# Patient Record
Sex: Female | Born: 1970 | Race: White | Hispanic: No | Marital: Married | Smoking: Current every day smoker
Health system: Southern US, Community
[De-identification: ages and names within clinical notes are randomized; demographics above are authoritative.]

## PROBLEM LIST (undated history)

## (undated) DIAGNOSIS — O24419 Gestational diabetes mellitus in pregnancy, unspecified control: Secondary | ICD-10-CM

## (undated) DIAGNOSIS — G43909 Migraine, unspecified, not intractable, without status migrainosus: Secondary | ICD-10-CM

## (undated) DIAGNOSIS — J45909 Unspecified asthma, uncomplicated: Secondary | ICD-10-CM

## (undated) HISTORY — DX: Migraine, unspecified, not intractable, without status migrainosus: G43.909

## (undated) HISTORY — DX: Unspecified asthma, uncomplicated: J45.909

## (undated) HISTORY — DX: Gestational diabetes mellitus in pregnancy, unspecified control: O24.419

---

## 2005-01-13 ENCOUNTER — Emergency Department: Payer: Self-pay | Admitting: Emergency Medicine

## 2005-04-21 ENCOUNTER — Emergency Department: Payer: Self-pay | Admitting: Unknown Physician Specialty

## 2005-07-01 ENCOUNTER — Emergency Department: Payer: Self-pay | Admitting: Emergency Medicine

## 2005-07-01 ENCOUNTER — Other Ambulatory Visit: Payer: Self-pay

## 2005-08-01 ENCOUNTER — Emergency Department: Payer: Self-pay | Admitting: Emergency Medicine

## 2005-12-28 ENCOUNTER — Emergency Department: Payer: Self-pay | Admitting: Emergency Medicine

## 2006-03-25 ENCOUNTER — Emergency Department: Payer: Self-pay | Admitting: Emergency Medicine

## 2006-08-24 ENCOUNTER — Emergency Department: Payer: Self-pay | Admitting: Emergency Medicine

## 2008-12-03 ENCOUNTER — Emergency Department: Payer: Self-pay | Admitting: Emergency Medicine

## 2009-06-11 ENCOUNTER — Emergency Department: Payer: Self-pay | Admitting: Emergency Medicine

## 2009-08-05 ENCOUNTER — Emergency Department: Payer: Self-pay | Admitting: Emergency Medicine

## 2010-04-09 ENCOUNTER — Emergency Department: Payer: Self-pay | Admitting: Emergency Medicine

## 2010-07-22 ENCOUNTER — Emergency Department: Payer: Self-pay | Admitting: Emergency Medicine

## 2010-08-24 ENCOUNTER — Emergency Department: Payer: Self-pay | Admitting: Internal Medicine

## 2011-09-18 ENCOUNTER — Emergency Department: Payer: Self-pay | Admitting: *Deleted

## 2011-11-21 ENCOUNTER — Emergency Department: Payer: Self-pay | Admitting: Emergency Medicine

## 2012-03-01 ENCOUNTER — Emergency Department: Payer: Self-pay | Admitting: Unknown Physician Specialty

## 2012-03-01 LAB — URINALYSIS, COMPLETE
Bilirubin,UR: NEGATIVE
Glucose,UR: NEGATIVE mg/dL (ref 0–75)
Ph: 5 (ref 4.5–8.0)
Protein: NEGATIVE
RBC,UR: 4 /HPF (ref 0–5)
Specific Gravity: 1.024 (ref 1.003–1.030)
Squamous Epithelial: 7
WBC UR: 20 /HPF (ref 0–5)

## 2012-03-01 LAB — COMPREHENSIVE METABOLIC PANEL
Anion Gap: 6 — ABNORMAL LOW (ref 7–16)
BUN: 10 mg/dL (ref 7–18)
Calcium, Total: 9.3 mg/dL (ref 8.5–10.1)
Co2: 26 mmol/L (ref 21–32)
EGFR (African American): >60
Glucose: 91 mg/dL (ref 65–99)
SGPT (ALT): 23 U/L
Sodium: 139 mmol/L (ref 136–145)
Total Protein: 7.9 g/dL (ref 6.4–8.2)

## 2012-03-01 LAB — CBC
MCH: 33.6 pg (ref 26.0–34.0)
MCHC: 34.4 g/dL (ref 32.0–36.0)
RDW: 13.1 % (ref 11.5–14.5)

## 2012-05-22 ENCOUNTER — Emergency Department: Payer: Self-pay | Admitting: Emergency Medicine

## 2012-07-05 ENCOUNTER — Emergency Department: Payer: Self-pay | Admitting: Emergency Medicine

## 2012-07-05 LAB — URINALYSIS, COMPLETE
Bacteria: NONE SEEN
Bilirubin,UR: NEGATIVE
Glucose,UR: NEGATIVE mg/dL (ref 0–75)
Ketone: NEGATIVE
Nitrite: NEGATIVE
Ph: 5 (ref 4.5–8.0)
RBC,UR: 1 /HPF (ref 0–5)
Specific Gravity: 1.017 (ref 1.003–1.030)
Squamous Epithelial: 2
WBC UR: <1 /HPF (ref 0–5)

## 2012-07-05 LAB — LIPASE, BLOOD: Lipase: 176 U/L (ref 73–393)

## 2012-07-05 LAB — COMPREHENSIVE METABOLIC PANEL
Albumin: 3.5 g/dL (ref 3.4–5.0)
Alkaline Phosphatase: 65 U/L (ref 50–136)
Anion Gap: 8 (ref 7–16)
BUN: 15 mg/dL (ref 7–18)
Chloride: 110 mmol/L — ABNORMAL HIGH (ref 98–107)
Creatinine: 0.75 mg/dL (ref 0.60–1.30)
Glucose: 122 mg/dL — ABNORMAL HIGH (ref 65–99)
Potassium: 4.4 mmol/L (ref 3.5–5.1)
SGOT(AST): 20 U/L (ref 15–37)
SGPT (ALT): 22 U/L (ref 12–78)
Total Protein: 7.4 g/dL (ref 6.4–8.2)

## 2012-07-05 LAB — CBC
MCH: 33.7 pg (ref 26.0–34.0)
MCHC: 34.6 g/dL (ref 32.0–36.0)
Platelet: 263 10*3/uL (ref 150–440)
RBC: 4.11 10*6/uL (ref 3.80–5.20)
RDW: 13 % (ref 11.5–14.5)
WBC: 6.8 10*3/uL (ref 3.6–11.0)

## 2012-07-05 LAB — PREGNANCY, URINE: Pregnancy Test, Urine: NEGATIVE m[IU]/mL

## 2012-11-28 ENCOUNTER — Emergency Department: Payer: Self-pay | Admitting: Emergency Medicine

## 2012-11-28 LAB — BASIC METABOLIC PANEL
BUN: 11 mg/dL (ref 7–18)
Creatinine: 0.55 mg/dL — ABNORMAL LOW (ref 0.60–1.30)
EGFR (African American): >60
EGFR (Non-African Amer.): >60
Potassium: 4.1 mmol/L (ref 3.5–5.1)
Sodium: 133 mmol/L — ABNORMAL LOW (ref 136–145)

## 2012-11-28 LAB — TROPONIN I: Troponin-I: <0.02 ng/mL

## 2012-11-28 LAB — CBC
HGB: 14.4 g/dL (ref 12.0–16.0)
MCH: 33.6 pg (ref 26.0–34.0)
MCV: 97 fL (ref 80–100)
Platelet: 297 10*3/uL (ref 150–440)
RBC: 4.3 10*6/uL (ref 3.80–5.20)
RDW: 12.8 % (ref 11.5–14.5)

## 2012-12-03 ENCOUNTER — Ambulatory Visit: Payer: Self-pay | Admitting: Cardiovascular Disease

## 2012-12-31 ENCOUNTER — Ambulatory Visit: Payer: Self-pay | Admitting: Cardiovascular Disease

## 2012-12-31 ENCOUNTER — Other Ambulatory Visit: Payer: Self-pay

## 2013-04-10 ENCOUNTER — Emergency Department: Payer: Self-pay | Admitting: Emergency Medicine

## 2013-05-23 ENCOUNTER — Emergency Department: Payer: Self-pay | Admitting: Emergency Medicine

## 2013-10-17 IMAGING — CR DG CHEST 2V
1 series · 2 of 2 positions shown · non-contrast
Comparison: none

REASON FOR EXAM: Chest Pain
COMMENTS:

PROCEDURE:     DXR - DXR CHEST PA (OR AP) AND LATERAL  - November 28, 2012 [DATE]
RESULT:     The lungs are clear. The heart and pulmonary vessels are normal.
The bony and mediastinal structures are unremarkable. There is no effusion.
There is no pneumothorax or evidence of congestive failure.

[Series 1: w chest pa · 0.14mm/px · 2 of 2 slices shown]
[im 1/2]
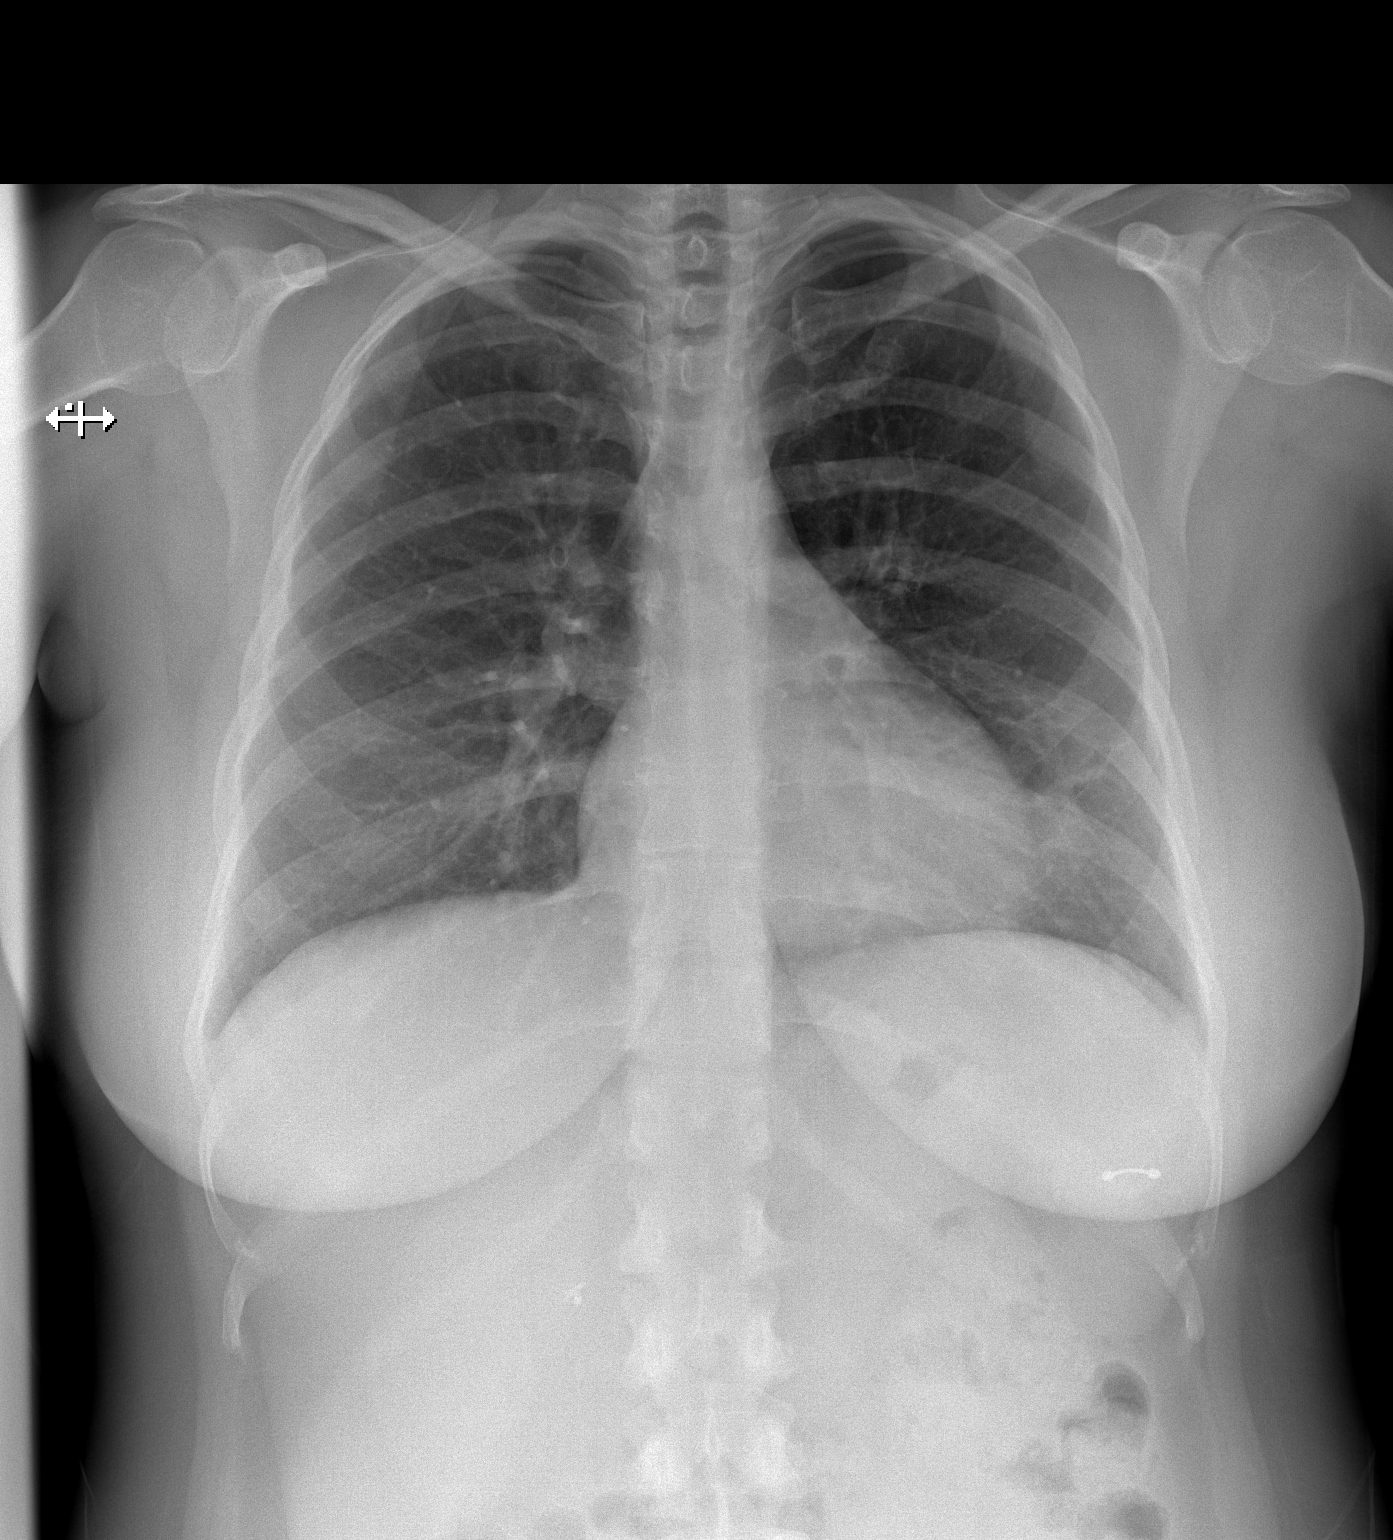
[im 2/2]
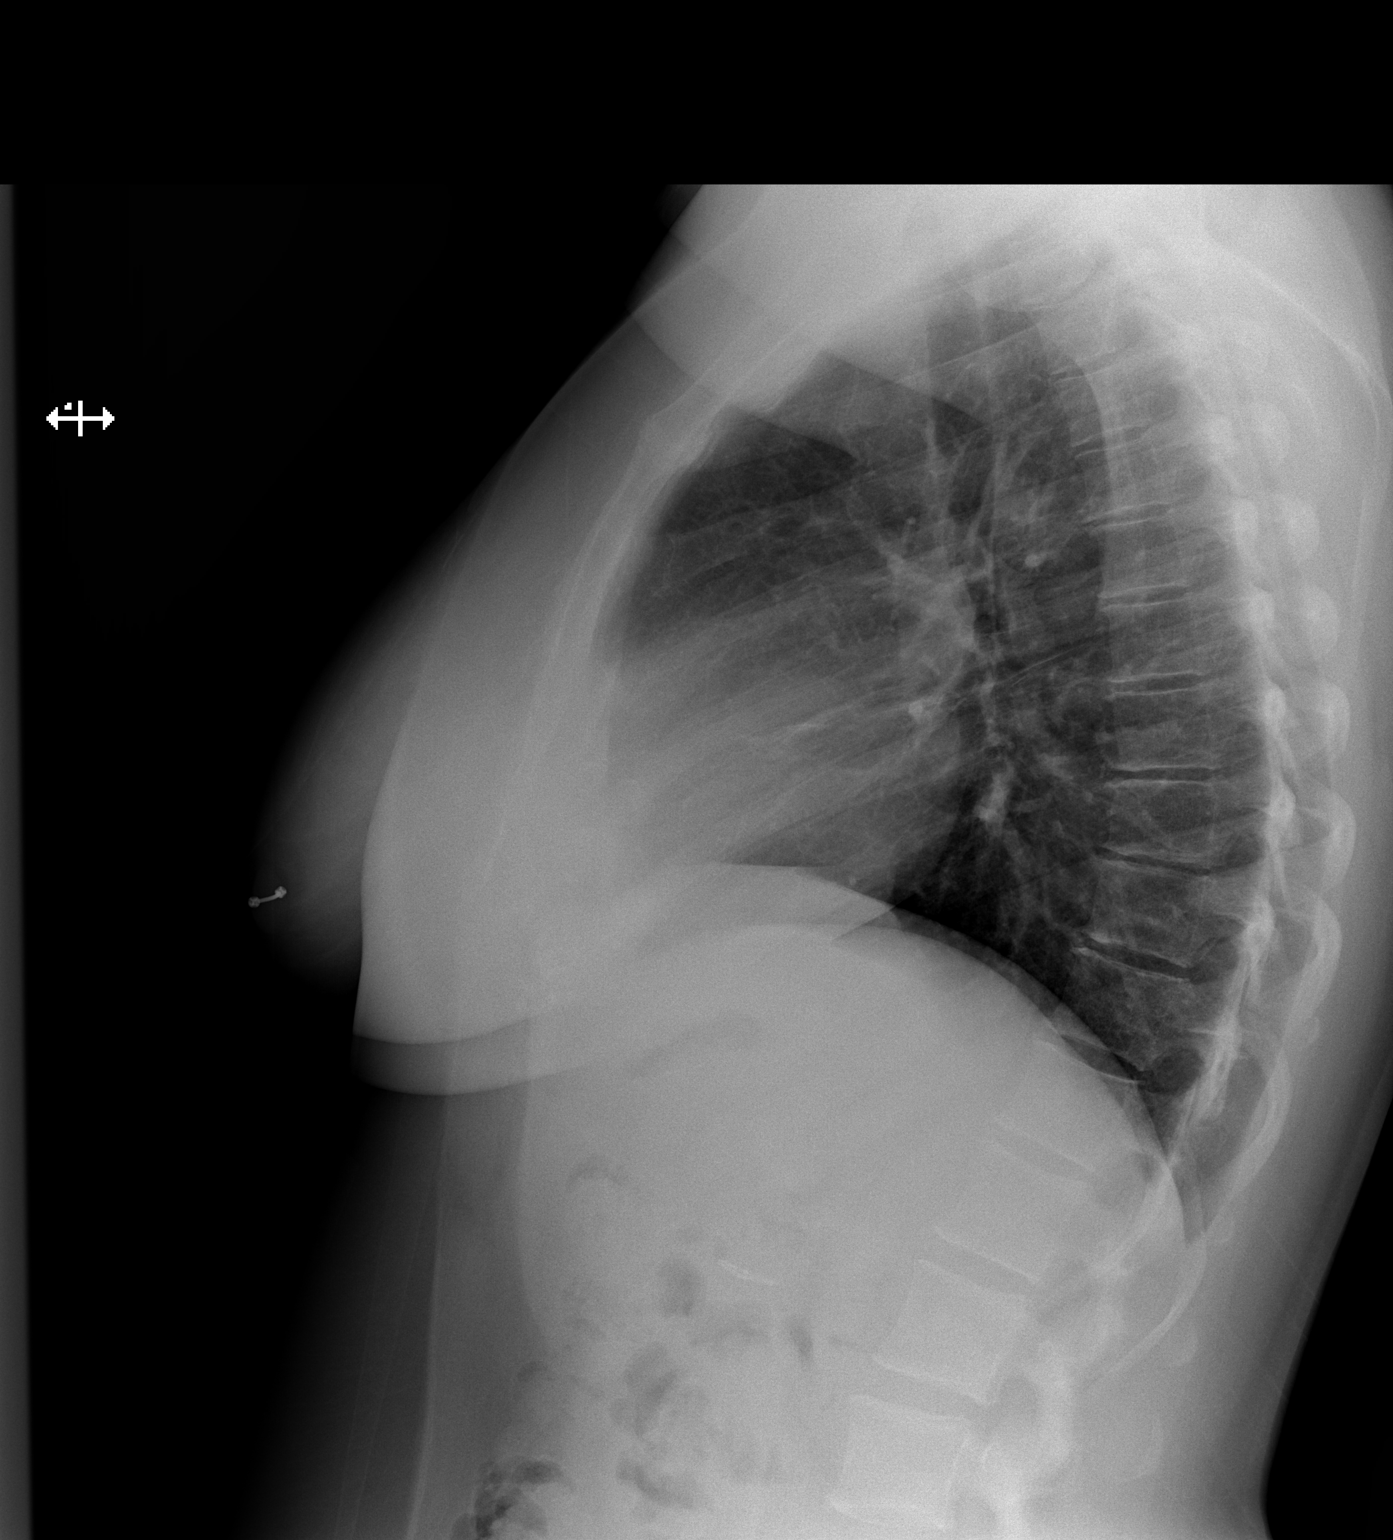

[2 of 2 positions shown; findings below may reference images not displayed]

IMPRESSION: No acute cardiopulmonary disease.

[REDACTED]

## 2014-01-09 ENCOUNTER — Emergency Department: Payer: Self-pay | Admitting: Emergency Medicine

## 2014-03-04 ENCOUNTER — Emergency Department: Payer: Self-pay | Admitting: Emergency Medicine

## 2014-03-04 LAB — CBC WITH DIFFERENTIAL/PLATELET
BASOS ABS: 0.1 10*3/uL (ref 0.0–0.1)
BASOS PCT: 0.9 %
EOS PCT: 1.3 %
Eosinophil #: 0.1 10*3/uL (ref 0.0–0.7)
HCT: 43.8 % (ref 35.0–47.0)
HGB: 15.2 g/dL (ref 12.0–16.0)
LYMPHS ABS: 1.6 10*3/uL (ref 1.0–3.6)
Lymphocyte %: 21.9 %
MCH: 34.2 pg — AB (ref 26.0–34.0)
MCHC: 34.6 g/dL (ref 32.0–36.0)
MCV: 99 fL (ref 80–100)
Monocyte #: 0.4 x10 3/mm (ref 0.2–0.9)
Monocyte %: 6.1 %
Neutrophil #: 5.1 10*3/uL (ref 1.4–6.5)
Neutrophil %: 69.8 %
Platelet: 244 10*3/uL (ref 150–440)
RBC: 4.43 10*6/uL (ref 3.80–5.20)
RDW: 13.6 % (ref 11.5–14.5)
WBC: 7.4 10*3/uL (ref 3.6–11.0)

## 2014-03-04 LAB — COMPREHENSIVE METABOLIC PANEL
ALBUMIN: 3.5 g/dL (ref 3.4–5.0)
ALK PHOS: 55 U/L
Anion Gap: 7 (ref 7–16)
BILIRUBIN TOTAL: 0.5 mg/dL (ref 0.2–1.0)
BUN: 8 mg/dL (ref 7–18)
Calcium, Total: 8.7 mg/dL (ref 8.5–10.1)
Chloride: 110 mmol/L — ABNORMAL HIGH (ref 98–107)
Co2: 21 mmol/L (ref 21–32)
Creatinine: 0.77 mg/dL (ref 0.60–1.30)
EGFR (Non-African Amer.): >60
Glucose: 94 mg/dL (ref 65–99)
Osmolality: 274 (ref 275–301)
Potassium: 3.8 mmol/L (ref 3.5–5.1)
SGOT(AST): 25 U/L (ref 15–37)
SGPT (ALT): 21 U/L (ref 12–78)
Sodium: 138 mmol/L (ref 136–145)
Total Protein: 7.6 g/dL (ref 6.4–8.2)

## 2014-03-04 LAB — URINALYSIS, COMPLETE
BILIRUBIN, UR: NEGATIVE
Glucose,UR: NEGATIVE mg/dL (ref 0–75)
Ketone: NEGATIVE
Leukocyte Esterase: NEGATIVE
Nitrite: NEGATIVE
PH: 5 (ref 4.5–8.0)
PROTEIN: NEGATIVE
RBC,UR: 8 /HPF (ref 0–5)
Specific Gravity: 1.02 (ref 1.003–1.030)
Squamous Epithelial: 2
WBC UR: 3 /HPF (ref 0–5)

## 2014-03-04 LAB — LIPASE, BLOOD: LIPASE: 144 U/L (ref 73–393)

## 2015-02-28 DIAGNOSIS — W1841XA Slipping, tripping and stumbling without falling due to stepping on object, initial encounter: Secondary | ICD-10-CM | POA: Insufficient documentation

## 2015-02-28 DIAGNOSIS — Y9289 Other specified places as the place of occurrence of the external cause: Secondary | ICD-10-CM | POA: Insufficient documentation

## 2015-02-28 DIAGNOSIS — Y998 Other external cause status: Secondary | ICD-10-CM | POA: Insufficient documentation

## 2015-02-28 DIAGNOSIS — Y9389 Activity, other specified: Secondary | ICD-10-CM | POA: Insufficient documentation

## 2015-02-28 DIAGNOSIS — S99912A Unspecified injury of left ankle, initial encounter: Secondary | ICD-10-CM | POA: Insufficient documentation

## 2015-02-28 NOTE — ED Notes (Signed)
Pt states she drover herself here and is having left ankle pain. pt is now in w/c. Pt states she stepped off her porch and injured her left ankle. mild swelling noted.

## 2015-03-01 ENCOUNTER — Emergency Department: Payer: Self-pay

## 2015-03-01 ENCOUNTER — Emergency Department
Admission: EM | Admit: 2015-03-01 | Discharge: 2015-03-01 | Discharge status: Against Medical Advice | Payer: Self-pay | Attending: Emergency Medicine | Admitting: Emergency Medicine

## 2017-02-03 ENCOUNTER — Emergency Department
Admission: EM | Admit: 2017-02-03 | Discharge: 2017-02-03 | Disposition: A | Discharge status: Home / Self Care | Payer: Self-pay | Attending: Emergency Medicine | Admitting: Emergency Medicine

## 2017-02-03 ENCOUNTER — Encounter: Payer: Self-pay | Admitting: Emergency Medicine

## 2017-02-03 DIAGNOSIS — J111 Influenza due to unidentified influenza virus with other respiratory manifestations: Secondary | ICD-10-CM | POA: Insufficient documentation

## 2017-02-03 DIAGNOSIS — J45909 Unspecified asthma, uncomplicated: Secondary | ICD-10-CM | POA: Insufficient documentation

## 2017-02-03 DIAGNOSIS — F17200 Nicotine dependence, unspecified, uncomplicated: Secondary | ICD-10-CM | POA: Insufficient documentation

## 2017-02-03 MED ORDER — OSELTAMIVIR PHOSPHATE 75 MG PO CAPS: 75.0000 mg | ORAL_CAPSULE | Freq: Two times a day (BID) | ORAL | 0 refills | Status: AC

## 2017-02-03 NOTE — ED Provider Notes (Signed)
[facility] Emergency Department Provider Note  ____________________________________________  Time seen: Approximately 3:39 PM  I have reviewed the triage vital signs and the nursing notes.   HISTORY  Chief Complaint Cough and Sore Throat    HPI Ann Chang is a 46 y.o. female presenting to the emergency department with headache, rhinorrhea, congestion, nonproductive cough, diarrhea and nausea for the past 2 days. Patient's niece was recently diagnosed with influenza. Patient is tolerating fluids by mouth. She has had diminished appetite, myalgias and increased sleep. Patient has been febrile. Fever has been as high as 101F assessed orally. Patient has tried Tylenol but no other alleviating measures.   Past Medical History:  Diagnosis Date  . Asthma   . Gestational diabetes   . Migraines     There are no active problems to display for this patient.   History reviewed. No pertinent surgical history.  Prior to Admission medications   Medication Sig Start Date End Date Taking? Authorizing Provider  oseltamivir (TAMIFLU) 75 MG capsule Take 1 capsule (75 mg total) by mouth 2 (two) times daily. 02/03/17 02/08/17  Orvil FeilWoods, Sherryll Skoczylas M, PA-C    Allergies Aspirin and Magnesium sulfate  No family history on file.  Social History Social History  Substance Use Topics  . Smoking status: Current Every Day Smoker  . Smokeless tobacco: Never Used  . Alcohol use No     Review of Systems  Constitutional: Patient has had fever.  Eyes: No visual changes. No discharge ENT: Patient has had congestion.  Cardiovascular: no chest pain. Respiratory: Patient has had non-productive cough.  No SOB. Gastrointestinal: Patient has had nausea.  Genitourinary: Negative for dysuria. No hematuria Musculoskeletal: Patient has had myalgias. Skin: Negative for rash, abrasions, lacerations, ecchymosis. Neurological: patient has had headache, no focal weakness or  numbness.   ____________________________________________   PHYSICAL EXAM:  VITAL SIGNS: ED Triage Vitals [02/03/17 1439]  Enc Vitals Group     BP (!) 126/109     Pulse Rate 84     Resp 16     Temp 98.8 F (37.1 C)     Temp Source Oral     SpO2 98 %     Weight      Height      Head Circumference      Peak Flow      Pain Score      Pain Loc      Pain Edu?      Excl. in GC?      Constitutional: Alert and oriented. Patient is lying supine in bed.  Eyes: Conjunctivae are normal. PERRL. EOMI. Head: Atraumatic. ENT:      Ears: Tympanic membranes are injected bilaterally without evidence of effusion or purulent exudate. Bony landmarks are visualized bilaterally. No pain with palpation at the tragus.      Nose: Nasal turbinates are edematous and erythematous. Copious rhinorrhea visualized.      Mouth/Throat: Mucous membranes are moist. Posterior pharynx is mildly erythematous. No tonsillar hypertrophy or purulent exudate. Uvula is midline. Neck: Full range of motion. No pain is elicited with flexion at the neck. Hematological/Lymphatic/Immunilogical: No cervical lymphadenopathy. Cardiovascular: Normal rate, regular rhythm. Normal S1 and S2.  Good peripheral circulation. Respiratory: Normal respiratory effort without tachypnea or retractions. Lungs CTAB. Good air entry to the bases with no decreased or absent breath sounds. Gastrointestinal: Bowel sounds 4 quadrants. Soft and nontender to palpation. No guarding or rigidity. No palpable masses. No distention. No CVA tenderness.  Skin:  Skin is warm, dry and intact. No rash noted. Psychiatric: Mood and affect are normal. Speech and behavior are normal. Patient exhibits appropriate insight and judgement.  ____________________________________________   LABS (all labs ordered are listed, but only abnormal results are displayed)  Labs Reviewed - No data to  display ____________________________________________  EKG   ____________________________________________  RADIOLOGY   No results found.  ____________________________________________    PROCEDURES  Procedure(s) performed:    Procedures    Medications - No data to display   ____________________________________________   INITIAL IMPRESSION / ASSESSMENT AND PLAN / ED COURSE  Pertinent labs & imaging results that were available during my care of the patient were reviewed by me and considered in my medical decision making (see chart for details).  Review of the [place] CSRS was performed in accordance of the [facility] prior to dispensing any controlled drugs.     Assessment and Plan:  Influenza: Patient presents to the emergency department with headache, rhinorrhea, congestion, myalgias, fatigue and diarrhea. Symptoms are consistent with influenza. Tamiflu was prescribed at discharge. Rest and hydration were encouraged. Patient was advised to follow-up with her primary care provider in one week. Physical exam vital signs are reassuring at this time. All patient questions were answered.   ____________________________________________  FINAL CLINICAL IMPRESSION(S) / ED DIAGNOSES  Final diagnoses:  Influenza      NEW MEDICATIONS STARTED DURING THIS VISIT:  New Prescriptions   OSELTAMIVIR (TAMIFLU) 75 MG CAPSULE    Take 1 capsule (75 mg total) by mouth 2 (two) times daily.        This chart was dictated using voice recognition software/Dragon. Despite best efforts to proofread, errors can occur which can change the meaning. Any change was purely unintentional.    Orvil Feil, PA-C 02/03/17 1544    Minna Antis, MD 02/03/17 [phone]

## 2017-02-03 NOTE — ED Triage Notes (Signed)
Pt states that she has been having cough and sore throat for the past 2 days. Pts father is fixing to have surgery and she wants to be sure she does not have anything that is contagious.

## 2017-07-29 ENCOUNTER — Other Ambulatory Visit: Payer: Self-pay

## 2017-07-29 ENCOUNTER — Emergency Department
Admission: EM | Admit: 2017-07-29 | Discharge: 2017-07-29 | Disposition: A | Discharge status: Home / Self Care | Payer: Self-pay | Attending: Emergency Medicine | Admitting: Emergency Medicine

## 2017-07-29 ENCOUNTER — Encounter: Payer: Self-pay | Admitting: Emergency Medicine

## 2017-07-29 ENCOUNTER — Emergency Department: Payer: Self-pay

## 2017-07-29 DIAGNOSIS — Y999 Unspecified external cause status: Secondary | ICD-10-CM | POA: Insufficient documentation

## 2017-07-29 DIAGNOSIS — S63502A Unspecified sprain of left wrist, initial encounter: Secondary | ICD-10-CM | POA: Insufficient documentation

## 2017-07-29 DIAGNOSIS — F17200 Nicotine dependence, unspecified, uncomplicated: Secondary | ICD-10-CM | POA: Insufficient documentation

## 2017-07-29 DIAGNOSIS — Y929 Unspecified place or not applicable: Secondary | ICD-10-CM | POA: Insufficient documentation

## 2017-07-29 DIAGNOSIS — J45909 Unspecified asthma, uncomplicated: Secondary | ICD-10-CM | POA: Insufficient documentation

## 2017-07-29 DIAGNOSIS — Y9389 Activity, other specified: Secondary | ICD-10-CM | POA: Insufficient documentation

## 2017-07-29 DIAGNOSIS — W228XXA Striking against or struck by other objects, initial encounter: Secondary | ICD-10-CM | POA: Insufficient documentation

## 2017-07-29 MED ORDER — IBUPROFEN 600 MG PO TABS: 600.0000 mg | ORAL_TABLET | Freq: Three times a day (TID) | ORAL | 0 refills | Status: AC | PRN

## 2017-07-29 MED ORDER — TRAMADOL HCL 50 MG PO TABS
50.0000 mg | ORAL_TABLET | Freq: Once | ORAL | Status: AC
Start: 2017-07-29 — End: 2017-07-29
  Administered 2017-07-29: 50 mg via ORAL
  Filled 2017-07-29: qty 1

## 2017-07-29 MED ORDER — IBUPROFEN 600 MG PO TABS
600.0000 mg | ORAL_TABLET | Freq: Once | ORAL | Status: AC
  Administered 2017-07-29: 600 mg via ORAL
  Filled 2017-07-29: qty 1

## 2017-07-29 MED ORDER — TRAMADOL HCL 50 MG PO TABS: 50.0000 mg | ORAL_TABLET | Freq: Four times a day (QID) | ORAL | 0 refills | Status: AC | PRN

## 2017-07-29 NOTE — ED Notes (Signed)
Pt to ed with c/o left wrist pain, pt states her dog pulled away from her last night and the pt had the leash in her hand.  Pt reports left hand hit door on car.

## 2017-07-29 NOTE — ED Provider Notes (Signed)
[facility] Emergency Department Provider Note   ____________________________________________   First MD Initiated Contact with Patient 07/29/17 1218     (approximate)  I have reviewed the triage vital signs and the nursing notes.   HISTORY  Chief Complaint Wrist Pain    HPI Ann Chang is a 46 y.o. female patient complaining of left wrist pain secondary to reported incident by her dog.Patient's stay pain increased with pronation supination and flexion of the wrist. Patient rates the pain as a 5/10. Patient described a pain as "achy". No palliative measures for complaint. Patient is right-hand dominant.   Past Medical History:  Diagnosis Date  . Asthma   . Gestational diabetes   . Migraines     There are no active problems to display for this patient.   History reviewed. No pertinent surgical history.  Prior to Admission medications   Medication Sig Start Date End Date Taking? Authorizing Provider  ibuprofen (ADVIL,MOTRIN) 600 MG tablet Take 1 tablet (600 mg total) every 8 (eight) hours as needed by mouth. 07/29/17   Joni ReiningSmith, Kately Graffam K, PA-C  traMADol (ULTRAM) 50 MG tablet Take 1 tablet (50 mg total) every 6 (six) hours as needed by mouth for moderate pain. 07/29/17   Joni ReiningSmith, Rashidah [place] K, PA-C    Allergies Aspirin and Magnesium sulfate  No family history on file.  Social History Social History   Tobacco Use  . Smoking status: Current Every Day Smoker  . Smokeless tobacco: Never Used  Substance Use Topics  . Alcohol use: No  . Drug use: Not on file    Review of Systems  Constitutional: No fever/chills Eyes: No visual changes. ENT: No sore throat. Cardiovascular: Denies chest pain. Respiratory: Denies shortness of breath. Gastrointestinal: No abdominal pain.  No nausea, no vomiting.  No diarrhea.  No constipation. Genitourinary: Negative for dysuria. Musculoskeletal: Left wrist pain Skin: Negative for rash. Neurological:  Negative for headaches, focal weakness or numbness. Allergic/Immunilogical: Aspirin and magnesium sulfate. ____________________________________________   PHYSICAL EXAM:  VITAL SIGNS: ED Triage Vitals  Enc Vitals Group     BP 07/29/17 1145 (!) 166/87     Pulse Rate 07/29/17 1145 71     Resp 07/29/17 1145 20     Temp 07/29/17 1145 97.9 F (36.6 C)     Temp Source 07/29/17 1145 Oral     SpO2 07/29/17 1145 96 %     Weight 07/29/17 1146 149 lb (67.6 kg)     Height 07/29/17 1146 5' 0.5" (1.537 m)     Head Circumference --      Peak Flow --      Pain Score 07/29/17 1145 5     Pain Loc --      Pain Edu? --      Excl. in GC? --     Constitutional: Alert and oriented. Well appearing and in no acute distress. Neck: No stridor. Cardiovascular: Normal rate, regular rhythm. Grossly normal heart sounds.  Good peripheral circulation. Elevated blood pressure. Respiratory: Normal respiratory effort.  No retractions. Lungs CTAB. Gastrointestinal: Soft and nontender. No distention. No abdominal bruits. No CVA tenderness. Musculoskeletal: No obvious deformity to the left wrist. Patient is moderate guarding palpation distal radius. Patient decreased range of motion about complaining of pain. Neurologic:  Normal speech and language. No gross focal neurologic deficits are appreciated. No gait instability. Skin:  Skin is warm, dry and intact. No rash noted. Psychiatric: Mood and affect are normal. Speech and behavior are normal.  ____________________________________________   LABS (all labs ordered are listed, but only abnormal results are displayed)  Labs Reviewed - No data to display ____________________________________________  EKG   ____________________________________________  RADIOLOGY  Dg Wrist Complete Left  Result Date: 07/29/2017 CLINICAL DATA:  Hit wrist on car door EXAM: LEFT WRIST - COMPLETE 3+ VIEW COMPARISON:  None. FINDINGS: Frontal, oblique, lateral, and ulnar deviation  scaphoid images were obtained. No evident fracture or dislocation. Joint spaces appear normal. No erosive change. A a benign cyst is noted in the trapezial bone proximally. IMPRESSION: No fracture or dislocation.  No evident arthropathic change. Electronically Signed   By: Bretta BangWilliam  Woodruff III M.D.   On: 07/29/2017 12:42    ____________________________________________   PROCEDURES  Procedure(s) performed: None  Procedures  Critical Care performed: No  ____________________________________________   INITIAL IMPRESSION / ASSESSMENT AND PLAN / ED COURSE  As part of my medical decision making, I reviewed the following data within the electronic MEDICAL RECORD NUMBER    This pain secondary to sprain. Discussed negative x-ray finding with patient. Patient given discharge Instructions and placed in a wrist splint. [facility].      ____________________________________________   FINAL CLINICAL IMPRESSION(S) / ED DIAGNOSES  Final diagnoses:  Sprain of left wrist, initial encounter     ED Discharge Orders        Ordered    traMADol (ULTRAM) 50 MG tablet  Every 6 hours PRN     07/29/17 1300    ibuprofen (ADVIL,MOTRIN) 600 MG tablet  Every 8 hours PRN     07/29/17 1300       Note:  This document was prepared using Dragon voice recognition software and may include unintentional dictation errors.    Joni ReiningSmith, Lattie Cervi K, PA-C 07/29/17 1304    Rockne MenghiniNorman, Anne-Caroline, MD 07/31/17 2152

## 2017-07-29 NOTE — ED Triage Notes (Signed)
States dog pulled her and hit L wrist on car.

## 2017-07-29 NOTE — Discharge Instructions (Signed)
Wrist splint for 3-5 days as needed. °

## 2018-06-17 IMAGING — DX DG WRIST COMPLETE 3+V*L*
4 series · 4 of 4 positions shown · non-contrast
Comparison: None.

CLINICAL DATA: Hit wrist on car door

EXAM:
LEFT WRIST - COMPLETE 3+ VIEW

[wrist ap (1 of 2)]
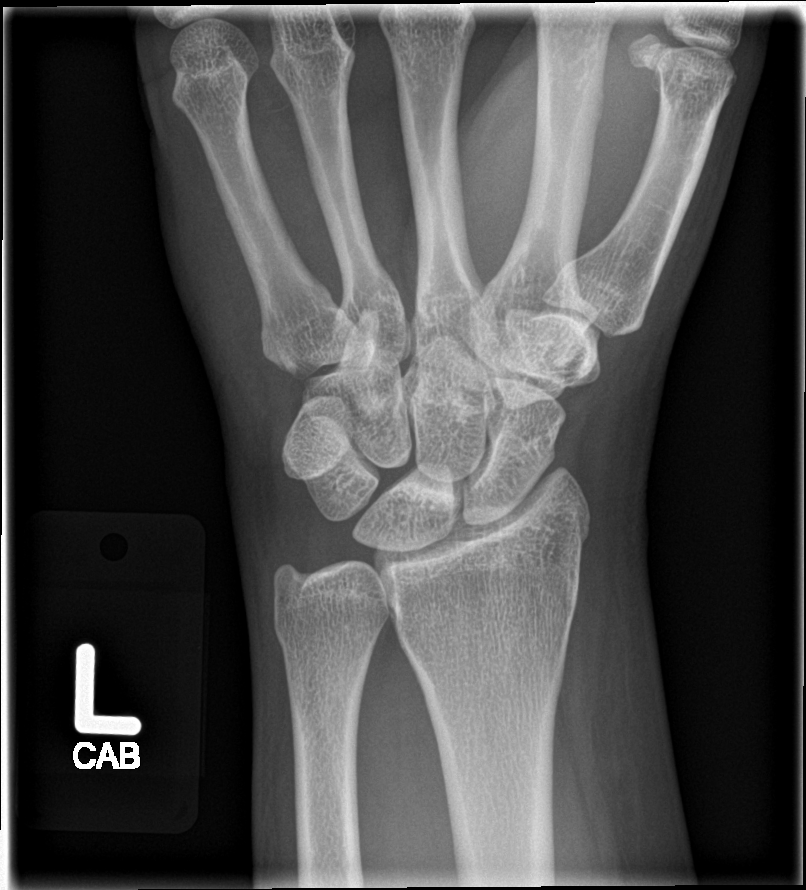

[wrist obl]
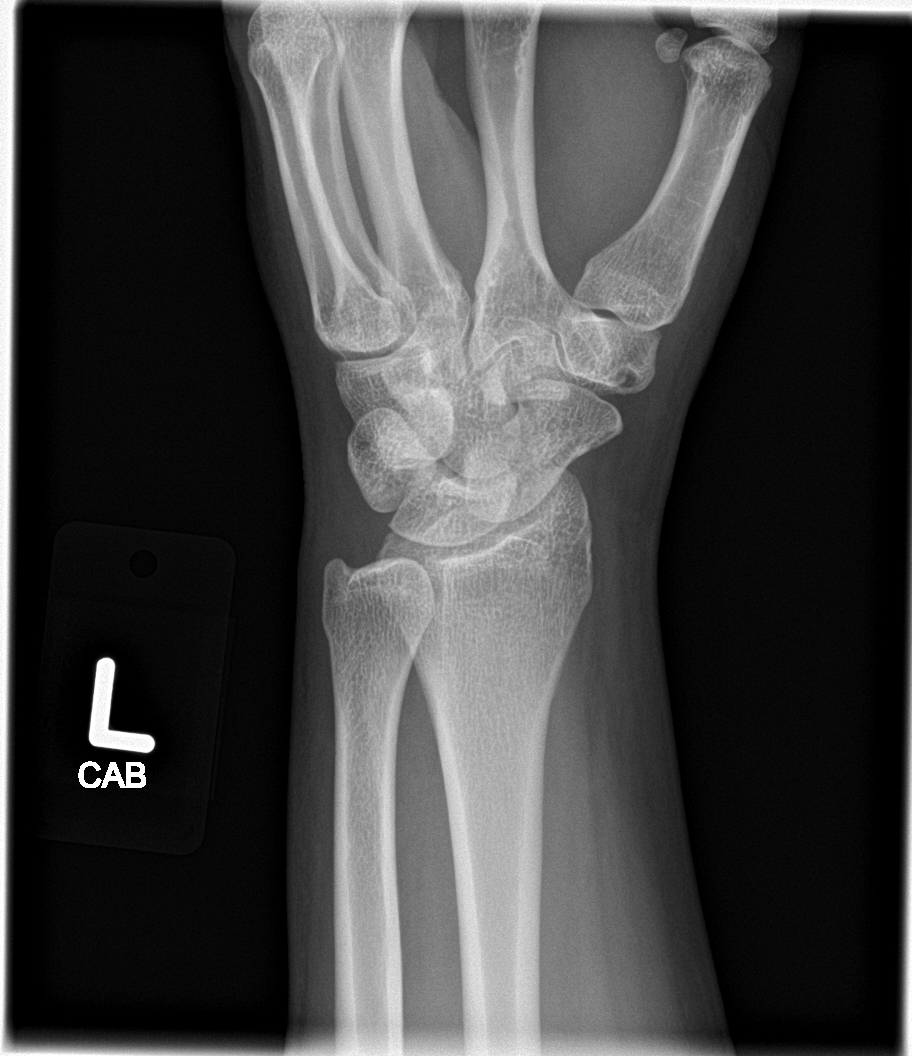

[wrist lat]
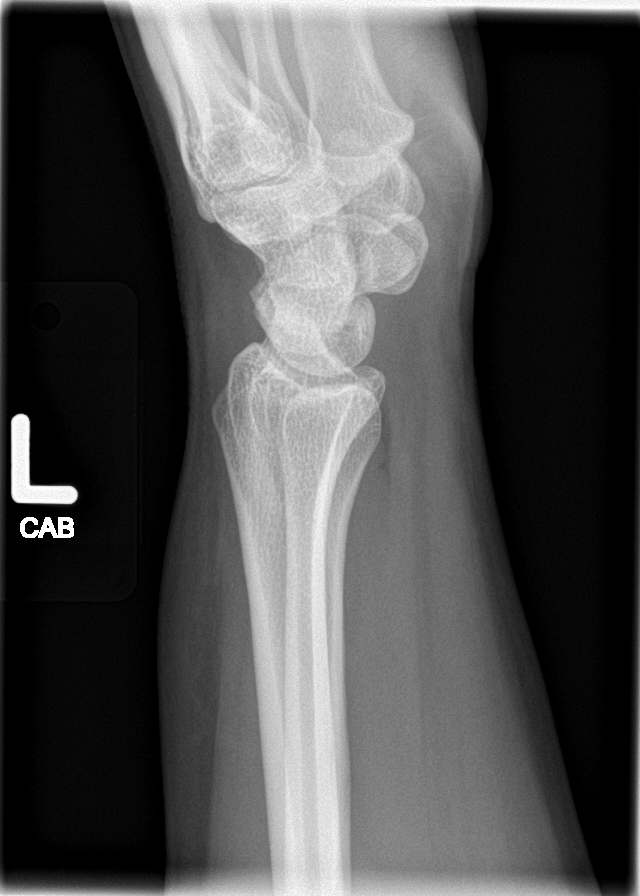

[wrist ap (2 of 2)]
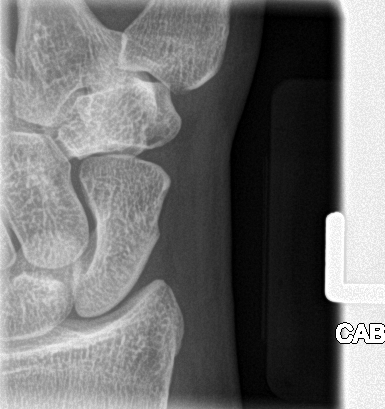

[4 of 4 positions shown; findings below may reference images not displayed]

FINDINGS: Frontal, oblique, lateral, and ulnar deviation scaphoid images were
obtained. No evident fracture or dislocation. Joint spaces appear
normal. No erosive change. A a benign cyst is noted in the trapezial
bone proximally.
IMPRESSION: No fracture or dislocation.  No evident arthropathic change.

## 2021-09-24 ENCOUNTER — Emergency Department: Admission: EM | Admit: 2021-09-24 | Discharge: 2021-09-24 | Discharge status: Against Medical Advice | Payer: Self-pay
